# Patient Record
Sex: Male | Born: 1950 | Race: Black or African American | Hispanic: No | Marital: Married | Smoking: Never smoker
Health system: Southern US, Community
[De-identification: ages and names within clinical notes are randomized; demographics above are authoritative.]

## PROBLEM LIST (undated history)

## (undated) HISTORY — PX: HERNIA REPAIR: SHX51

---

## 2015-05-14 ENCOUNTER — Encounter: Payer: Self-pay | Admitting: Emergency Medicine

## 2015-05-14 ENCOUNTER — Emergency Department
Admission: EM | Admit: 2015-05-14 | Discharge: 2015-05-15 | Disposition: A | Discharge status: Home / Self Care | Payer: Self-pay | Attending: Emergency Medicine | Admitting: Emergency Medicine

## 2015-05-14 DIAGNOSIS — K5900 Constipation, unspecified: Secondary | ICD-10-CM | POA: Insufficient documentation

## 2015-05-14 DIAGNOSIS — R109 Unspecified abdominal pain: Secondary | ICD-10-CM | POA: Insufficient documentation

## 2015-05-14 DIAGNOSIS — H11001 Unspecified pterygium of right eye: Secondary | ICD-10-CM | POA: Insufficient documentation

## 2015-05-14 LAB — COMPREHENSIVE METABOLIC PANEL
ALT: 16 U/L — ABNORMAL LOW (ref 17–63)
AST: 23 U/L (ref 15–41)
Albumin: 4.1 g/dL (ref 3.5–5.0)
Alkaline Phosphatase: 68 U/L (ref 38–126)
Anion gap: 6 (ref 5–15)
BUN: 12 mg/dL (ref 6–20)
CALCIUM: 9 mg/dL (ref 8.9–10.3)
CO2: 28 mmol/L (ref 22–32)
CREATININE: 0.91 mg/dL (ref 0.61–1.24)
Chloride: 102 mmol/L (ref 101–111)
GFR calc Af Amer: >60 mL/min (ref >60)
GFR calc non Af Amer: >60 mL/min (ref >60)
Glucose, Bld: 122 mg/dL — ABNORMAL HIGH (ref 65–99)
Potassium: 4.4 mmol/L (ref 3.5–5.1)
SODIUM: 136 mmol/L (ref 135–145)
Total Bilirubin: 0.6 mg/dL (ref 0.3–1.2)
Total Protein: 7.4 g/dL (ref 6.5–8.1)

## 2015-05-14 LAB — CBC
HEMATOCRIT: 40.6 % (ref 40.0–52.0)
Hemoglobin: 13.2 g/dL (ref 13.0–18.0)
MCH: 28.5 pg (ref 26.0–34.0)
MCHC: 32.4 g/dL (ref 32.0–36.0)
MCV: 87.9 fL (ref 80.0–100.0)
PLATELETS: 101 10*3/uL — AB (ref 150–440)
RBC: 4.62 MIL/uL (ref 4.40–5.90)
RDW: 17.9 % — AB (ref 11.5–14.5)
WBC: 4.8 10*3/uL (ref 3.8–10.6)

## 2015-05-14 LAB — TYPE AND SCREEN
ABO/RH(D): A POS
Antibody Screen: NEGATIVE

## 2015-05-14 NOTE — ED Notes (Signed)
Pt reports rectal bleeding intermittently for 1 month, reports some black stools but mostly red blood in stools. Pt reports diffuse abdomindal pain, worse in RLQ. Pt denies any nausea or vomiting.

## 2015-05-15 ENCOUNTER — Emergency Department: Payer: Self-pay

## 2015-05-15 LAB — ABO/RH: ABO/RH(D): A POS

## 2015-05-15 MED ORDER — SODIUM CHLORIDE 0.9 % IV BOLUS (SEPSIS)
1000.0000 mL | Freq: Once | INTRAVENOUS | Status: AC
  Administered 2015-05-15: 1000 mL via INTRAVENOUS

## 2015-05-15 MED ORDER — IOHEXOL 300 MG/ML  SOLN
100.0000 mL | Freq: Once | INTRAMUSCULAR | Status: AC | PRN
  Administered 2015-05-15: 100 mL via INTRAVENOUS

## 2015-05-15 MED ORDER — ONDANSETRON HCL 4 MG/2ML IJ SOLN
INTRAMUSCULAR | Status: AC
  Filled 2015-05-15: qty 2

## 2015-05-15 MED ORDER — POLYETHYLENE GLYCOL 3350 17 GM/SCOOP PO POWD: 17.0000 g | Freq: Two times a day (BID) | ORAL | Status: AC

## 2015-05-15 MED ORDER — MORPHINE SULFATE 4 MG/ML IJ SOLN
INTRAMUSCULAR | Status: AC
  Filled 2015-05-15: qty 1

## 2015-05-15 MED ORDER — IOHEXOL 240 MG/ML SOLN
25.0000 mL | Freq: Once | INTRAMUSCULAR | Status: AC | PRN
  Administered 2015-05-15: 25 mL via ORAL

## 2015-05-15 NOTE — ED Notes (Signed)
Pt reports rectal bleeding intermittently for 1 month, reports some black stools but mostly red blood in stools. Pt reports diffuse abdomindal pain, worse in RUQ RLQ. Pt denies any nausea or vomiting.

## 2015-05-15 NOTE — ED Notes (Signed)
+  hemoccult

## 2015-05-15 NOTE — Discharge Instructions (Signed)
Please take your medication as prescribed. Please follow-up with Dr. Mechele Collin by calling the number provided for further workup/evaluation of your rectal bleeding and abdominal pain. Return to the emergency department for any worsening abdominal pain, fever, worsening GI bleeding, or any other symptom personally concerning to yourself.    Abdominal Pain Many things can cause belly (abdominal) pain. Most times, the belly pain is not dangerous. Many cases of belly pain can be watched and treated at home. HOME CARE   Do not take medicines that help you go poop (laxatives) unless told to by your doctor.  Only take medicine as told by your doctor.  Eat or drink as told by your doctor. Your doctor will tell you if you should be on a special diet. GET HELP IF:  You do not know what is causing your belly pain.  You have belly pain while you are sick to your stomach (nauseous) or have runny poop (diarrhea).  You have pain while you pee or poop.  Your belly pain wakes you up at night.  You have belly pain that gets worse or better when you eat.  You have belly pain that gets worse when you eat fatty foods.  You have a fever. GET HELP RIGHT AWAY IF:   The pain does not go away within 2 hours.  You keep throwing up (vomiting).  The pain changes and is only in the right or left part of the belly.  You have bloody or tarry looking poop. MAKE SURE YOU:   Understand these instructions.  Will watch your condition.  Will get help right away if you are not doing well or get worse. Document Released: 03/15/2008 Document Revised: 10/02/2013 Document Reviewed: 06/06/2013 [facility] Patient Information 2015 [place], [place]. This information is not intended to replace advice given to you by your health care provider. Make sure you discuss any questions you have with your health care provider.  Constipation Constipation is when a person:  Poops (has a bowel movement) less than 3 times a  week.  Has a hard time pooping.  Has poop that is dry, hard, or bigger than normal. HOME CARE   Eat foods with a lot of fiber in them. This includes fruits, vegetables, beans, and whole grains such as brown rice.  Avoid fatty foods and foods with a lot of sugar. This includes french fries, hamburgers, cookies, candy, and soda.  If you are not getting enough fiber from food, take products with added fiber in them (supplements).  Drink enough fluid to keep your pee (urine) clear or pale yellow.  Exercise on a regular basis, or as told by your doctor.  Go to the restroom when you feel like you need to poop. Do not hold it.  Only take medicine as told by your doctor. Do not take medicines that help you poop (laxatives) without talking to your doctor first. GET HELP RIGHT AWAY IF:   You have bright red blood in your poop (stool).  Your constipation lasts more than 4 days or gets worse.  You have belly (abdominal) or butt (rectal) pain.  You have thin poop (as thin as a pencil).  You lose weight, and it cannot be explained. MAKE SURE YOU:   Understand these instructions.  Will watch your condition.  Will get help right away if you are not doing well or get worse. Document Released: 03/15/2008 Document Revised: 10/02/2013 Document Reviewed: 07/09/2013 [facility] Patient Information 2015 [place], [place]. This information is not intended to  replace advice given to you by your health care provider. Make sure you discuss any questions you have with your health care provider. ° °

## 2015-05-15 NOTE — ED Provider Notes (Signed)
[facility] Emergency Department Provider Note  Time seen: 12:32 AM  I have reviewed the triage vital signs and the nursing notes.   HISTORY  Chief Complaint Rectal Bleeding and Abdominal Pain    HPI Adam Booth is a 64 y.o. male with a past medical history of a hernia repair who presents the emergency department for right-sided abdominal pains and intermittent GI bleeding. According to the patient for the past 1 month he has been having intermittent bleeding with his stool. He describes the blood as bright red in color. Patient states the bleeding only happens intermittently. However for the past 2 days he is also been having progressively worsening right-sided abdominal pain. Denies nausea, vomiting, diarrhea, fever, dysuria. Patient describes the right-sided abdominal pain as moderate (7-8/10) currently. No modifying factors identified.    History reviewed. No pertinent past medical history.  There are no active problems to display for this patient.   Past Surgical History  Procedure Laterality Date  . Hernia repair      No current outpatient prescriptions on file.  Allergies Review of patient's allergies indicates no known allergies.  No family history on file.  Social History History  Substance Use Topics  . Smoking status: Never Smoker   . Smokeless tobacco: Not on file  . Alcohol Use: No    Review of Systems Constitutional: Negative for fever. Cardiovascular: Negative for chest pain. Respiratory: Negative for shortness of breath. Gastrointestinal: Positive for right-sided abdominal pain. Negative for nausea, vomiting, diarrhea. Positive for intermittent bleeding with his stool. Denies black stool. Genitourinary: Negative for dysuria. Musculoskeletal: Negative for back pain. 10-point ROS otherwise negative.  ____________________________________________   PHYSICAL EXAM:  VITAL SIGNS: ED Triage Vitals  Enc Vitals Group   BP 05/14/15 2153 160/79 mmHg     Pulse Rate 05/14/15 2153 66     Resp 05/14/15 2153 16     Temp 05/14/15 2153 98 F (36.7 C)     Temp Source 05/14/15 2153 Oral     SpO2 05/14/15 2153 100 %     Weight 05/14/15 2153 143 lb 4.8 oz (65 kg)     Height 05/14/15 2153 5\' 5"  (1.651 m)     Head Cir --      Peak Flow --      Pain Score 05/14/15 2157 8     Pain Loc --      Pain Edu? --      Excl. in GC? --     Constitutional: Alert and oriented. Well appearing and in no distress. Eyes: Normal exam, pterygium of right eye ENT   Mouth/Throat: Mucous membranes are moist. Cardiovascular: Normal rate, regular rhythm. No murmur Respiratory: Normal respiratory effort without tachypnea nor retractions. Breath sounds are clear and equal bilaterally. No wheezes/rales/rhonchi. Gastrointestinal: Moderate right abdominal tenderness to palpation. No rebound or guarding. No distention. No CVA tenderness palpation. Rectal exam is nontender, normal light brown stool, guaiac negative. No hemorrhoids visualized. Musculoskeletal: Nontender with normal range of motion Neurologic:  Normal speech and language. No gross focal neurologic deficits  Skin:  Skin is warm, dry and intact.  Psychiatric: Mood and affect are normal. Speech and behavior are normal.   ____________________________________________    INITIAL IMPRESSION / ASSESSMENT AND PLAN / ED COURSE  Pertinent labs & imaging results that were available during my care of the patient were reviewed by me and considered in my medical decision making (see chart for details).  Patient right-sided abdominal pain 2 days, and intermittent  rectal bleeding times one month. Currently the patient's rectal exam is nontender, no hemorrhoids, guaiac negative light brown stool. Patient's labs are largely within normal limits. Given the patient's moderate tenderness to palpation on the right side we'll proceed with a CT abdomen and pelvis to further evaluate the  patient's pain complaints.   CT most consistent with considerable constipation. I discussed with the patient need to use a laxity. The patient states he has been having bowel movements although they have been smaller than normal. He states he last had a bowel movement this morning. We'll place the patient on a accident (MiraLAX) twice daily for the next 7 days, have the patient follow-up with his primary care doctor. Patient will also follow up with GI medicine regarding his intermittent GI bleeding. Patient and son are agreeable to plan.  ____________________________________________   FINAL CLINICAL IMPRESSION(S) / ED DIAGNOSES  Right-sided abdominal pain Constipation Rectal bleeding   Minna Antis, MD 05/15/15 [phone]

## 2015-05-15 NOTE — ED Notes (Signed)
Pt refused meds wanted to wait to see if it gets worse

## 2015-05-20 ENCOUNTER — Encounter: Payer: Self-pay | Admitting: Family Medicine

## 2015-05-20 ENCOUNTER — Ambulatory Visit (INDEPENDENT_AMBULATORY_CARE_PROVIDER_SITE_OTHER): Payer: Self-pay | Admitting: Family Medicine

## 2015-05-20 VITALS — BP 140/90 | HR 65 | Temp 98.7°F | Resp 16 | Ht 67.75 in | Wt 144.0 lb

## 2015-05-20 DIAGNOSIS — Z125 Encounter for screening for malignant neoplasm of prostate: Secondary | ICD-10-CM

## 2015-05-20 DIAGNOSIS — K625 Hemorrhage of anus and rectum: Secondary | ICD-10-CM

## 2015-05-20 DIAGNOSIS — Z0000 Encounter for general adult medical examination without abnormal findings: Secondary | ICD-10-CM

## 2015-05-20 NOTE — Progress Notes (Signed)
Patient: Adam Booth Male    DOB: 12-21-1950   64 y.o.   MRN: [id number] Visit Date: 05/20/2015  Today's Provider: Lelon Huh, MD   Chief Complaint  Patient presents with  . Establish Care  . Rectal Bleeding    2-3 months ago   Subjective:    HPI  New Patient establishing Care: Patient is here to establish care as a new patient. Patient is from [place] and is the father Marvyn Torrez. Patient Has a complaint of having several episodes of blood in his stool. He states that it has been occuring intermittently for the past 3 months. No rectal pain. He has also been constipated off and on, but blood and constipation due not seem to be related. The last episode of blood in stool occurred 2 months ago. Patient was seen in the ER on 05/15/2015 for Constipation and was prescribed Miralax. Stool was guaiac negative. ER workup included CBC and Met C remarkable for mild thrombocytopenia, and abdominal CT finding severe constipation. He has never had a colonoscopy and his son would like to him to get this evaluated evaluated before the patient returns to [place].   Results for orders placed or performed during the hospital encounter of 05/14/15  Comprehensive metabolic panel  Result Value Ref Range   Sodium 136 135 - 145 mmol/L   Potassium 4.4 3.5 - 5.1 mmol/L   Chloride 102 101 - 111 mmol/L   CO2 28 22 - 32 mmol/L   Glucose, Bld 122 (H) 65 - 99 mg/dL   BUN 12 6 - 20 mg/dL   Creatinine, Ser 0.91 0.61 - 1.24 mg/dL   Calcium 9.0 8.9 - 10.3 mg/dL   Total Protein 7.4 6.5 - 8.1 g/dL   Albumin 4.1 3.5 - 5.0 g/dL   AST 23 15 - 41 U/L   ALT 16 (L) 17 - 63 U/L   Alkaline Phosphatase 68 38 - 126 U/L   Total Bilirubin 0.6 0.3 - 1.2 mg/dL   GFR calc non Af Amer >60 >60 mL/min   GFR calc Af Amer >60 >60 mL/min   Anion gap 6 5 - 15  CBC  Result Value Ref Range   WBC 4.8 3.8 - 10.6 K/uL   RBC 4.62 4.40 - 5.90 MIL/uL   Hemoglobin 13.2 13.0 - 18.0 g/dL   HCT 40.6 40.0 - 52.0 %   MCV 87.9 80.0 - 100.0 fL   MCH 28.5 26.0 - 34.0 pg   MCHC 32.4 32.0 - 36.0 g/dL   RDW 17.9 (H) 11.5 - 14.5 %   Platelets 101 (L) 150 - 440 K/uL  Type and screen  Result Value Ref Range   ABO/RH(D) A POS    Antibody Screen NEG    Sample Expiration 05/17/2015   ABO/Rh  Result Value Ref Range   ABO/RH(D) A POS        No Known Allergies Previous Medications   MULTIPLE VITAMINS-MINERALS (MULTIVITAMIN ADULT PO)    Take 1 tablet by mouth daily.   POLYETHYLENE GLYCOL POWDER (MIRALAX) POWDER    Take 17 g by mouth 2 (two) times daily.    Review of Systems  Constitutional: Negative for fever, chills, diaphoresis and fatigue.  Gastrointestinal: Positive for constipation and blood in stool. Negative for nausea, vomiting, abdominal pain, diarrhea, abdominal distention, anal bleeding and rectal pain.  Genitourinary: Negative for dysuria, urgency, frequency, hematuria, flank pain, decreased urine volume, discharge, penile swelling, scrotal swelling, enuresis, genital sores, penile pain and  testicular pain.  All other systems reviewed and are negative.   History  Substance Use Topics  . Smoking status: Never Smoker   . Smokeless tobacco: Not on file  . Alcohol Use: No   Objective:   BP 140/90 mmHg  Pulse 65  Temp(Src) 98.7 F (37.1 C) (Oral)  Resp 16  Ht 5' 7.75" (1.721 m)  Wt 144 lb (65.318 kg)  BMI 22.05 kg/m2  SpO2 98%  Physical Exam  General Appearance:    Alert, cooperative, no distress  Eyes:    PERRL, conjunctiva/corneas clear, EOM's intact       Lungs:     Clear to auscultation bilaterally, respirations unlabored  Heart:    Regular rate and rhythm  Abd:   Benign        Assessment & Plan:     1. Rectal bleeding Evaluation of this is apparently not readily available where he resides in [place]. He has never had a colonoscopy. 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  - Ambulatory referral to  Gastroenterology  2. Prostate cancer screening  - PSA       Lelon Huh, MD  [facility]

## 2015-05-21 LAB — PSA: Prostate Specific Ag, Serum: 1.6 ng/mL (ref 0.0–4.0)

## 2015-06-27 ENCOUNTER — Encounter: Payer: Self-pay | Admitting: Family Medicine

## 2015-07-16 ENCOUNTER — Encounter: Payer: Self-pay | Admitting: Family Medicine

## 2017-02-01 IMAGING — CT CT ABD-PELV W/ CM
2 of 5 series · 16 of 46 positions shown, 18 images · IV contrast (omnipaque)
Comparison: None.

CLINICAL DATA: Intermittent rectal bleeding for 1 month, diffuse
abdominal pain, worse in RIGHT lower quadrant.

EXAM:
CT ABDOMEN AND PELVIS WITH CONTRAST
TECHNIQUE: Multidetector CT imaging of the abdomen and pelvis was performed
using the standard protocol following bolus administration of
intravenous contrast.
CONTRAST:  100mL OMNIPAQUE IOHEXOL 300 MG/ML  SOLN

[Series 2: routine abd pel with · axial · 0.65mm/px · z∈[-412,-22]mm · 13 of 88 slices shown, 15 images]
[im 5/88  soft-tissue]
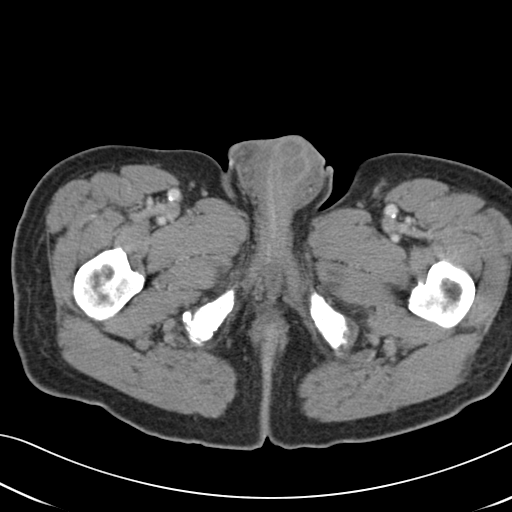
[im 5/88  bone]
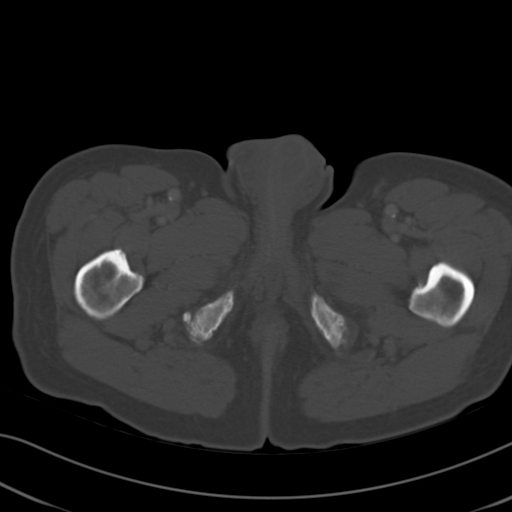
[im 10/88  soft-tissue]
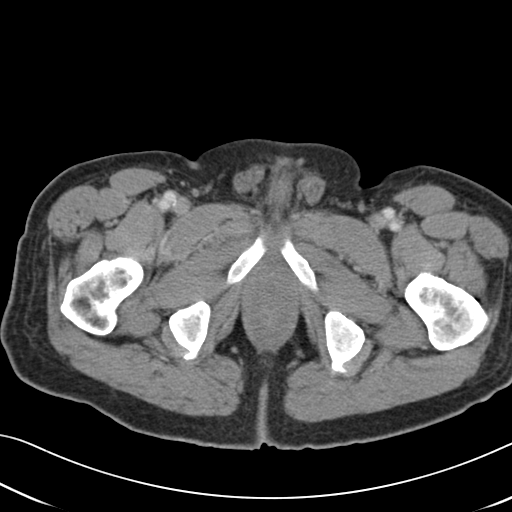
[im 20/88  soft-tissue]
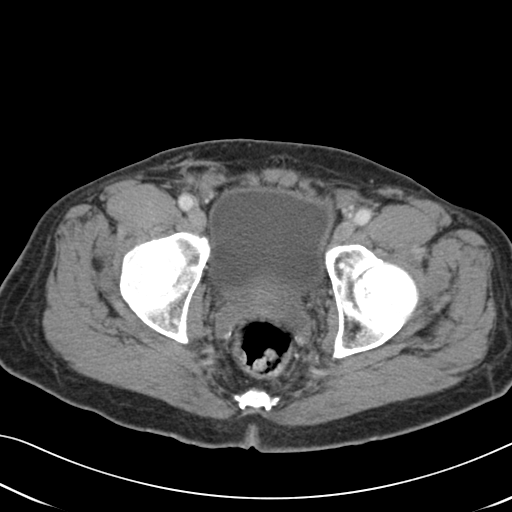
[im 25/88  soft-tissue]
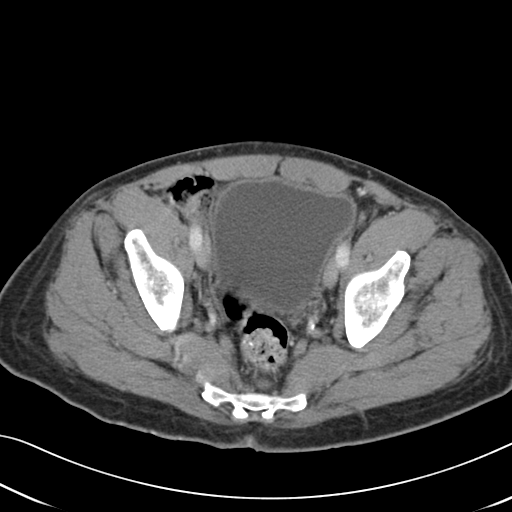
[im 30/88  soft-tissue]
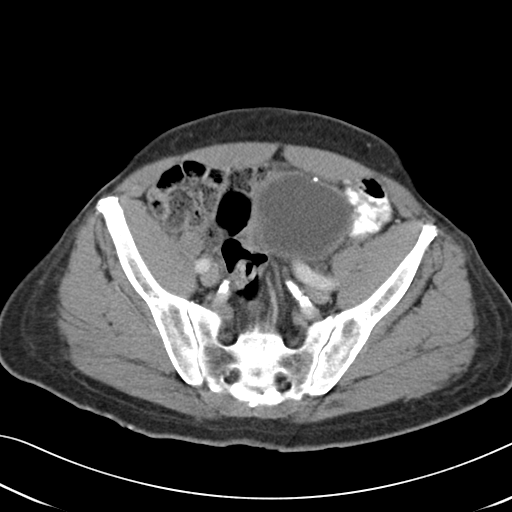
[im 39/88  soft-tissue]
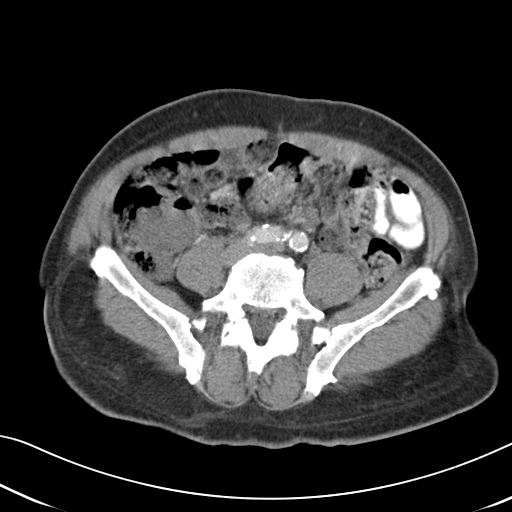
[im 44/88  soft-tissue]
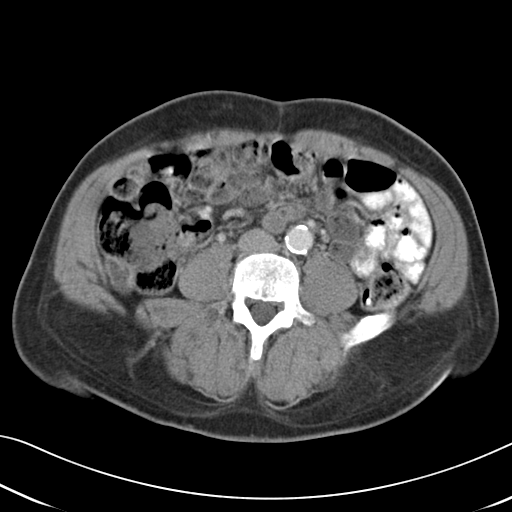
[im 49/88  soft-tissue]
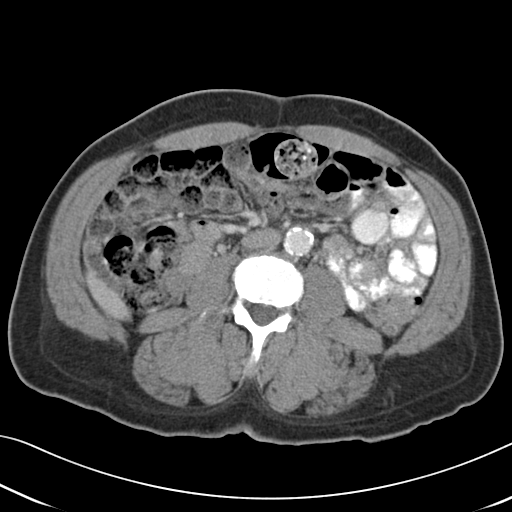
[im 59/88  soft-tissue]
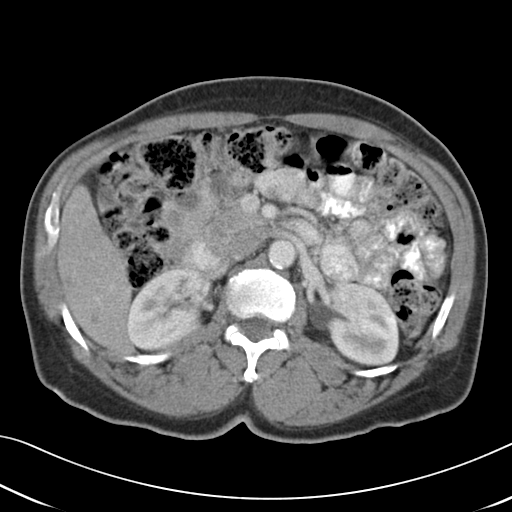
[im 59/88  bone]
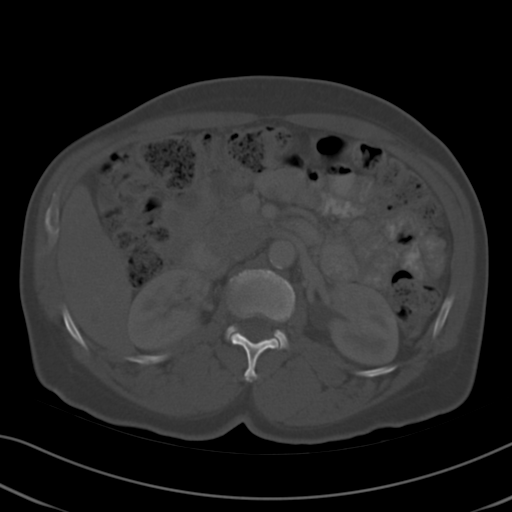
[im 63/88  soft-tissue]
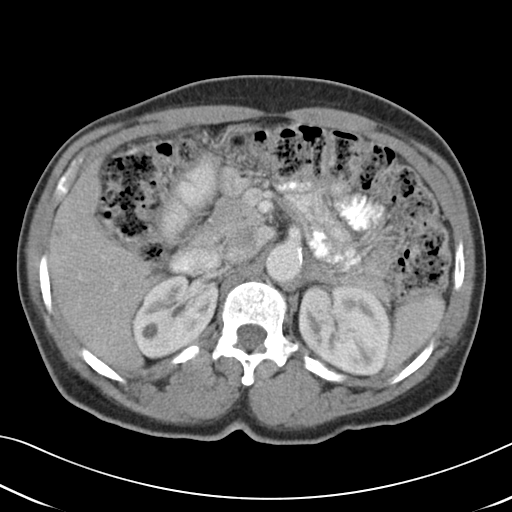
[im 68/88  soft-tissue]
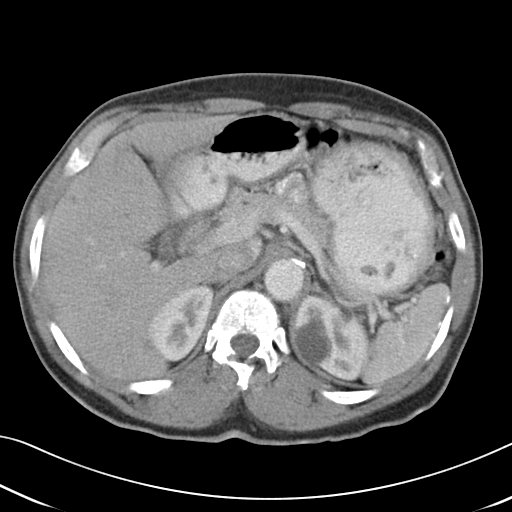
[im 78/88  soft-tissue]
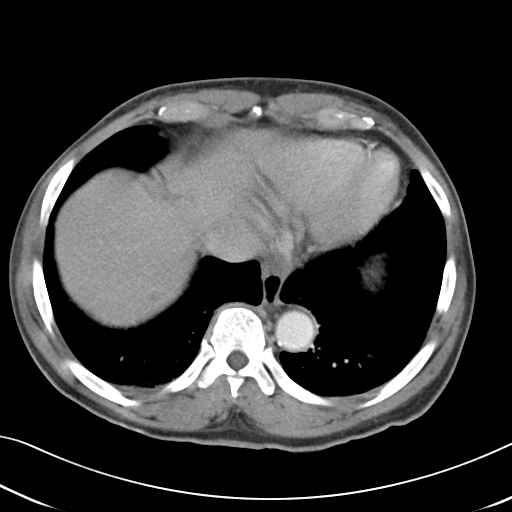
[im 83/88  soft-tissue]
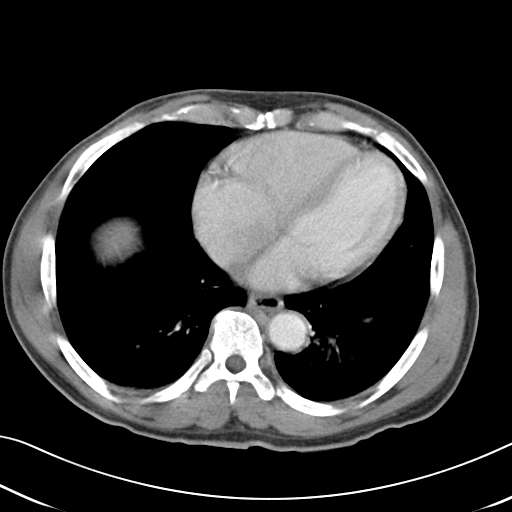

[Series 5: cor routine abd pel with · coronal · 0.89mm/px · 3 of 124 slices shown]
[im 42/124  soft-tissue]
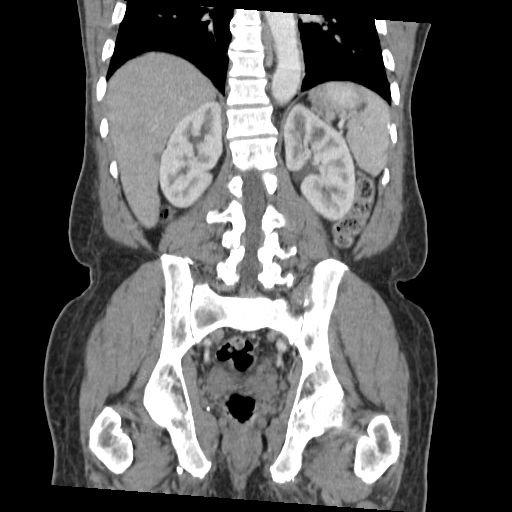
[im 55/124  soft-tissue]
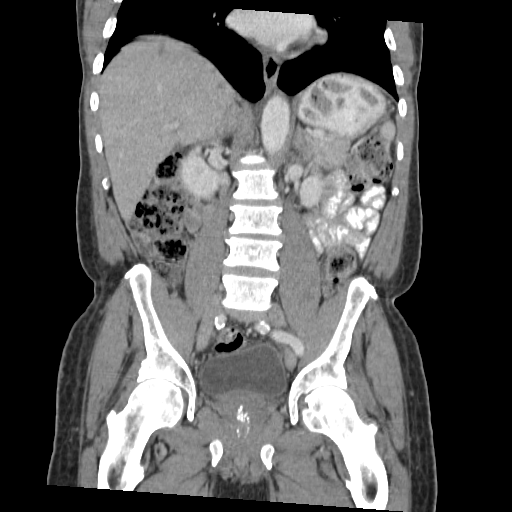
[im 69/124  soft-tissue]
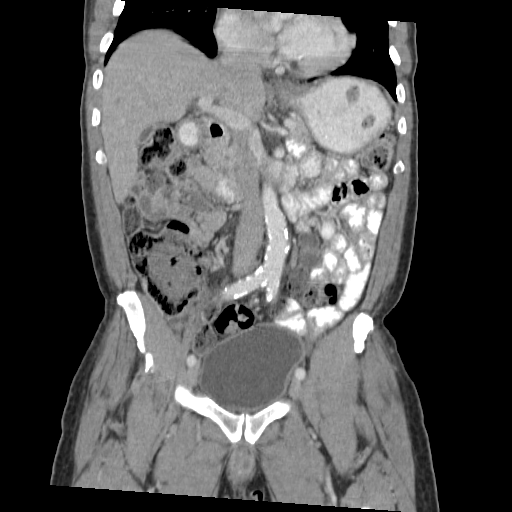

[16 of 46 positions shown; findings below may reference images not displayed]

FINDINGS: LUNG BASES: Lung bases are clear. The heart is mildly enlarged. Mild
coronary artery calcifications.

SOLID ORGANS: The liver demonstrates scattered subcentimeter
hypodensities likely representing cysts, and is otherwise
unremarkable. Gallbladder is contracted. Pancreas and adrenal glands
are unremarkable. Splenic capsular calcification, spleen is
otherwise unremarkable.

GASTROINTESTINAL TRACT: Small hiatal hernia. Moderate amount of
retained large bowel stool, in addition to small bowel feces. The
stomach, small and large bowel are normal in course and caliber
without inflammatory changes. The appendix is not discretely
identified, however there are no inflammatory changes in the right
lower quadrant.

KIDNEYS/ URINARY TRACT: Kidneys are orthotopic, demonstrating
symmetric enhancement. No nephrolithiasis, hydronephrosis or solid
renal masses. 2.9 cm LEFT upper pole simple cyst, additional smaller
cysts in the kidneys bilaterally. The unopacified ureters are normal
in course and caliber. Delayed imaging through the kidneys
demonstrates symmetric prompt contrast excretion within the proximal
urinary collecting system. Urinary bladder is partially distended.

PERITONEUM/RETROPERITONEUM: Aortoiliac vessels are normal in course
and caliber, moderate calcific atherosclerosis. No lymphadenopathy
by CT size criteria. Prostate is enlarged, invading the base the
urinary bladder. No intraperitoneal free fluid nor free air.

SOFT TISSUE/OSSEOUS STRUCTURES: Non-suspicious. Small fat containing
RIGHT inguinal hernia. Chronic bilateral L5 pars interarticularis
defects without spondylolisthesis.
IMPRESSION: Moderate amount of retained large bowel stool with small bowel feces
compatible chronic stasis, no bowel obstruction.

Prostatomegaly.
